# Patient Record
Sex: Male | Born: 1990 | Race: Black or African American | Hispanic: No | Marital: Single | Smoking: Current every day smoker
Health system: Southern US, Community
[De-identification: ages and names within clinical notes are randomized; demographics above are authoritative.]

---
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
## 2013-10-16 ENCOUNTER — Emergency Department (HOSPITAL_COMMUNITY)
Admission: EM | Admit: 2013-10-16 | Discharge: 2013-10-17 | Disposition: A | Discharge status: Home / Self Care | Payer: Worker's Compensation | Attending: Emergency Medicine | Admitting: Emergency Medicine

## 2013-10-16 ENCOUNTER — Encounter (HOSPITAL_COMMUNITY): Payer: Self-pay | Admitting: Emergency Medicine

## 2013-10-16 DIAGNOSIS — Y990 Civilian activity done for income or pay: Secondary | ICD-10-CM | POA: Insufficient documentation

## 2013-10-16 DIAGNOSIS — R198 Other specified symptoms and signs involving the digestive system and abdomen: Secondary | ICD-10-CM | POA: Insufficient documentation

## 2013-10-16 DIAGNOSIS — T23269A Burn of second degree of back of unspecified hand, initial encounter: Secondary | ICD-10-CM | POA: Insufficient documentation

## 2013-10-16 DIAGNOSIS — X088XXA Exposure to other specified smoke, fire and flames, initial encounter: Secondary | ICD-10-CM | POA: Insufficient documentation

## 2013-10-16 DIAGNOSIS — Z23 Encounter for immunization: Secondary | ICD-10-CM | POA: Insufficient documentation

## 2013-10-16 DIAGNOSIS — T23202A Burn of second degree of left hand, unspecified site, initial encounter: Secondary | ICD-10-CM

## 2013-10-16 DIAGNOSIS — Z7982 Long term (current) use of aspirin: Secondary | ICD-10-CM | POA: Insufficient documentation

## 2013-10-16 DIAGNOSIS — F17200 Nicotine dependence, unspecified, uncomplicated: Secondary | ICD-10-CM | POA: Insufficient documentation

## 2013-10-16 DIAGNOSIS — T22212A Burn of second degree of left forearm, initial encounter: Secondary | ICD-10-CM

## 2013-10-16 DIAGNOSIS — Y929 Unspecified place or not applicable: Secondary | ICD-10-CM | POA: Insufficient documentation

## 2013-10-16 DIAGNOSIS — Y9389 Activity, other specified: Secondary | ICD-10-CM | POA: Insufficient documentation

## 2013-10-16 DIAGNOSIS — T22219A Burn of second degree of unspecified forearm, initial encounter: Secondary | ICD-10-CM | POA: Insufficient documentation

## 2013-10-16 MED ORDER — FENTANYL CITRATE 0.05 MG/ML IJ SOLN
INTRAMUSCULAR | Status: AC
  Filled 2013-10-16: qty 2

## 2013-10-16 MED ORDER — FENTANYL CITRATE 0.05 MG/ML IJ SOLN
50.0000 ug | Freq: Once | INTRAMUSCULAR | Status: AC
  Administered 2013-10-16: 50 ug via NASAL

## 2013-10-16 MED ORDER — SILVER SULFADIAZINE 1 % EX CREA
TOPICAL_CREAM | Freq: Once | CUTANEOUS | Status: AC
  Administered 2013-10-16: 1 via TOPICAL
  Filled 2013-10-16: qty 85
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
## 2013-10-17 LAB — RAPID URINE DRUG SCREEN, HOSP PERFORMED
Amphetamines: NOT DETECTED
BARBITURATES: NOT DETECTED
BENZODIAZEPINES: NOT DETECTED
Cocaine: NOT DETECTED
Opiates: NOT DETECTED
Tetrahydrocannabinol: POSITIVE — AB

## 2013-10-17 MED ORDER — TETANUS-DIPHTH-ACELL PERTUSSIS 5-2.5-18.5 LF-MCG/0.5 IM SUSP
0.5000 mL | Freq: Once | INTRAMUSCULAR | Status: AC
  Administered 2013-10-17: 0.5 mL via INTRAMUSCULAR
  Filled 2013-10-17: qty 0.5

## 2013-10-17 MED ORDER — IBUPROFEN 600 MG PO TABS
600.0000 mg | ORAL_TABLET | Freq: Four times a day (QID) | ORAL | Status: DC | PRN
Start: 2013-10-17 — End: 2014-01-28

## 2013-10-17 MED ORDER — SILVER SULFADIAZINE 1 % EX CREA: 1.0000 "application " | TOPICAL_CREAM | Freq: Every day | CUTANEOUS | Status: DC

## 2013-10-17 MED ORDER — OXYCODONE-ACETAMINOPHEN 5-325 MG PO TABS: 1.0000 | ORAL_TABLET | ORAL | Status: DC | PRN

## 2013-10-17 MED ORDER — HYDROCODONE-ACETAMINOPHEN 5-325 MG PO TABS
2.0000 | ORAL_TABLET | Freq: Once | ORAL | Status: AC
  Administered 2013-10-17: 2 via ORAL
  Filled 2013-10-17: qty 2

## 2013-10-17 NOTE — Discharge Instructions (Signed)

## 2014-01-28 ENCOUNTER — Encounter (HOSPITAL_COMMUNITY): Payer: Self-pay | Admitting: Emergency Medicine

## 2014-01-28 ENCOUNTER — Emergency Department (HOSPITAL_COMMUNITY)
Admission: EM | Admit: 2014-01-28 | Discharge: 2014-01-28 | Disposition: A | Discharge status: Home / Self Care | Payer: No Typology Code available for payment source | Attending: Emergency Medicine | Admitting: Emergency Medicine

## 2014-01-28 DIAGNOSIS — R51 Headache: Secondary | ICD-10-CM

## 2014-01-28 DIAGNOSIS — S0990XA Unspecified injury of head, initial encounter: Secondary | ICD-10-CM | POA: Insufficient documentation

## 2014-01-28 DIAGNOSIS — R519 Headache, unspecified: Secondary | ICD-10-CM

## 2014-01-28 DIAGNOSIS — F17200 Nicotine dependence, unspecified, uncomplicated: Secondary | ICD-10-CM | POA: Insufficient documentation

## 2014-01-28 DIAGNOSIS — Y92410 Unspecified street and highway as the place of occurrence of the external cause: Secondary | ICD-10-CM | POA: Insufficient documentation

## 2014-01-28 DIAGNOSIS — R040 Epistaxis: Secondary | ICD-10-CM | POA: Insufficient documentation

## 2014-01-28 DIAGNOSIS — Y9389 Activity, other specified: Secondary | ICD-10-CM | POA: Insufficient documentation

## 2014-01-28 MED ORDER — ACETAMINOPHEN 325 MG PO TABS
650.0000 mg | ORAL_TABLET | Freq: Once | ORAL | Status: AC
  Administered 2014-01-28: 650 mg via ORAL
  Filled 2014-01-28: qty 2
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
## 2014-01-28 NOTE — ED Provider Notes (Signed)
Medical screening examination/treatment/procedure(s) were performed by non-physician practitioner and as supervising physician I was immediately available for consultation/collaboration.     Suzi RootsKevin E Cameshia Cressman, MD 01/28/14 1210

## 2018-05-02 ENCOUNTER — Emergency Department (HOSPITAL_COMMUNITY)
Admission: EM | Admit: 2018-05-02 | Discharge: 2018-05-02 | Disposition: A | Discharge status: Home / Self Care | Payer: 59 | Attending: Emergency Medicine | Admitting: Emergency Medicine

## 2018-05-02 ENCOUNTER — Encounter (HOSPITAL_COMMUNITY): Payer: Self-pay | Admitting: Emergency Medicine

## 2018-05-02 ENCOUNTER — Other Ambulatory Visit: Payer: Self-pay

## 2018-05-02 ENCOUNTER — Emergency Department (HOSPITAL_COMMUNITY): Payer: 59

## 2018-05-02 DIAGNOSIS — F17210 Nicotine dependence, cigarettes, uncomplicated: Secondary | ICD-10-CM | POA: Diagnosis not present

## 2018-05-02 DIAGNOSIS — R509 Fever, unspecified: Secondary | ICD-10-CM | POA: Diagnosis present

## 2018-05-02 DIAGNOSIS — K047 Periapical abscess without sinus: Secondary | ICD-10-CM | POA: Insufficient documentation

## 2018-05-02 DIAGNOSIS — R05 Cough: Secondary | ICD-10-CM | POA: Diagnosis not present

## 2018-05-02 LAB — CBC WITH DIFFERENTIAL/PLATELET
Abs Immature Granulocytes: 0.01 10*3/uL (ref 0.00–0.07)
BASOS ABS: 0 10*3/uL (ref 0.0–0.1)
Basophils Relative: 1 %
EOS PCT: 7 %
Eosinophils Absolute: 0.3 10*3/uL (ref 0.0–0.5)
HCT: 45.6 % (ref 39.0–52.0)
Hemoglobin: 14.8 g/dL (ref 13.0–17.0)
Immature Granulocytes: 0 %
Lymphocytes Relative: 25 %
Lymphs Abs: 1.1 10*3/uL (ref 0.7–4.0)
MCH: 28.5 pg (ref 26.0–34.0)
MCHC: 32.5 g/dL (ref 30.0–36.0)
MCV: 87.7 fL (ref 80.0–100.0)
MONO ABS: 0.5 10*3/uL (ref 0.1–1.0)
Monocytes Relative: 11 %
NRBC: 0 % (ref 0.0–0.2)
Neutro Abs: 2.4 10*3/uL (ref 1.7–7.7)
Neutrophils Relative %: 56 %
Platelets: 183 10*3/uL (ref 150–400)
RBC: 5.2 MIL/uL (ref 4.22–5.81)
RDW: 12.6 % (ref 11.5–15.5)
WBC: 4.3 10*3/uL (ref 4.0–10.5)

## 2018-05-02 LAB — I-STAT CHEM 8, ED
BUN: 9 mg/dL (ref 6–20)
CALCIUM ION: 1.18 mmol/L (ref 1.15–1.40)
Chloride: 102 mmol/L (ref 98–111)
Creatinine, Ser: 1 mg/dL (ref 0.61–1.24)
Glucose, Bld: 90 mg/dL (ref 70–99)
HCT: 45 % (ref 39.0–52.0)
Hemoglobin: 15.3 g/dL (ref 13.0–17.0)
Potassium: 4.2 mmol/L (ref 3.5–5.1)
SODIUM: 139 mmol/L (ref 135–145)
TCO2: 27 mmol/L (ref 22–32)

## 2018-05-02 MED ORDER — SODIUM CHLORIDE 0.9 % IV BOLUS
1000.0000 mL | Freq: Once | INTRAVENOUS | Status: AC
  Administered 2018-05-02: 1000 mL via INTRAVENOUS

## 2018-05-02 MED ORDER — AMOXICILLIN-POT CLAVULANATE 875-125 MG PO TABS: 1.0000 | ORAL_TABLET | Freq: Two times a day (BID) | ORAL | 0 refills | Status: DC

## 2018-05-02 MED ORDER — ACETAMINOPHEN 325 MG PO TABS
650.0000 mg | ORAL_TABLET | Freq: Once | ORAL | Status: AC | PRN
  Administered 2018-05-02: 650 mg via ORAL
  Filled 2018-05-02: qty 2

## 2018-05-02 NOTE — ED Provider Notes (Signed)
[facility]-EMERGENCY DEPT Provider Note   CSN: [id number] Arrival date & time: 05/02/18  0545     History   Chief Complaint Chief Complaint  Patient presents with  . Fever  . Generalized Body Aches    HPI Donna Resor is a 27 y.o. male.  HPI Patient states that he has felt bad to have fevers for the last several days.  Occasional cough.  States he does have a fair amount of pain in his right jaw however.  States his mouth feels dry.  Has had a good appetite but states he does not want to eat anything.  No abdominal pain.  No dysuria.  No sick contacts.  No dysuria.  He does not have a dentist.  No difficulty swallowing. History reviewed. No pertinent past medical history.  There are no active problems to display for this patient.   History reviewed. No pertinent surgical history.      Home Medications    Prior to Admission medications   Medication Sig Start Date End Date Taking? Authorizing Provider  acetaminophen (TYLENOL) 500 MG tablet Take 1,000 mg by mouth every 6 (six) hours as needed for mild pain.   Yes [provider]  ibuprofen (ADVIL,MOTRIN) 200 MG tablet Take 800 mg by mouth every 6 (six) hours as needed for moderate pain.   Yes [provider]  amoxicillin-clavulanate (AUGMENTIN) 875-125 MG tablet Take 1 tablet by mouth every 12 (twelve) hours. 05/02/18   Benjiman Core, MD    Family History No family history on file.  Social History Social History   Tobacco Use  . Smoking status: Current Every Day Smoker  Substance Use Topics  . Alcohol use: Yes  . Drug use: No     Allergies   Patient has no known allergies.   Review of Systems Review of Systems  Constitutional: Positive for appetite change and fever.  HENT: Positive for dental problem.   Respiratory: Negative for shortness of breath.   Gastrointestinal: Negative for abdominal pain.  Genitourinary: Negative for frequency.  Musculoskeletal: Positive  for myalgias. Negative for back pain.  Skin: Negative for rash.  Neurological: Negative for weakness.  Hematological: Negative for adenopathy.  Psychiatric/Behavioral: Negative for confusion.     Physical Exam Updated Vital Signs BP 135/72 (BP Location: Left Arm)   Pulse 85   Temp 98.9 F (37.2 C) (Oral)   Resp 18   Ht 5\' 10"  (1.778 m)   Wt 74.8 kg   SpO2 99%   BMI 23.68 kg/m   Physical Exam  Constitutional: He appears well-developed.  HENT:  Head: Normocephalic.  Tenderness over right lower jaw/mandible.  There is a swelling without fluctuance laterally.  No swelling of floor mouth.  Eyes: Pupils are equal, round, and reactive to light.  Neck: Neck supple.  Cardiovascular: Normal rate.  Pulmonary/Chest:  Mildly harsh breath sounds  Abdominal: There is no tenderness.  Musculoskeletal: He exhibits no edema or tenderness.  Neurological: He is alert.  Skin: Skin is warm. Capillary refill takes less than 2 seconds.     ED Treatments / Results  Labs (all labs ordered are listed, but only abnormal results are displayed) Labs Reviewed  CBC WITH DIFFERENTIAL/PLATELET  I-STAT CHEM 8, ED    EKG None  Radiology Dg Chest 2 View  Result Date: 05/02/2018 CLINICAL DATA:  Fever. EXAM: CHEST - 2 VIEW COMPARISON:  None. FINDINGS: The heart size and mediastinal contours are within normal limits. Both lungs are clear. The visualized  skeletal structures are unremarkable. IMPRESSION: No active cardiopulmonary disease. Electronically Signed   By: Lupita Raider, M.D.   On: 05/02/2018 07:50    Procedures Procedures (including critical care time)  Medications Ordered in ED Medications  acetaminophen (TYLENOL) tablet 650 mg (650 mg Oral Given 05/02/18 0731)  sodium chloride 0.9 % bolus 1,000 mL (0 mLs Intravenous Stopped 05/02/18 0833)     Initial Impression / Assessment and Plan / ED Course  I have reviewed the triage vital signs and the nursing notes.  Pertinent labs &  imaging results that were available during my care of the patient were reviewed by me and considered in my medical decision making (see chart for details).    Patient's fever.  I think due to dental abscess.  Labs reassuring.  Minus fever vitals reassuring.  Will discharge home with oral treatment.  Outpatient follow-up.  Final Clinical Impressions(s) / ED Diagnoses   Final diagnoses:  Dental infection    ED Discharge Orders         Ordered    amoxicillin-clavulanate (AUGMENTIN) 875-125 MG tablet  Every 12 hours     05/02/18 0835           Benjiman Core, MD 05/02/18 1610

## 2018-05-02 NOTE — ED Triage Notes (Signed)
Pt from home with c/o fever and generalized aches x 2 days. Pt denies cough or cold. Pt reports he last took motrin around 2100 last night

## 2018-05-02 NOTE — Discharge Instructions (Signed)
Follow up with a dentist

## 2021-02-25 ENCOUNTER — Encounter (HOSPITAL_COMMUNITY): Payer: Self-pay | Admitting: *Deleted

## 2021-02-25 ENCOUNTER — Other Ambulatory Visit: Payer: Self-pay

## 2021-02-25 ENCOUNTER — Emergency Department (HOSPITAL_COMMUNITY)
Admission: EM | Admit: 2021-02-25 | Discharge: 2021-02-25 | Disposition: A | Discharge status: Home / Self Care | Payer: Self-pay | Attending: Emergency Medicine | Admitting: Emergency Medicine

## 2021-02-25 ENCOUNTER — Emergency Department (HOSPITAL_COMMUNITY): Payer: Self-pay

## 2021-02-25 ENCOUNTER — Emergency Department (HOSPITAL_BASED_OUTPATIENT_CLINIC_OR_DEPARTMENT_OTHER): Payer: Self-pay

## 2021-02-25 DIAGNOSIS — F17200 Nicotine dependence, unspecified, uncomplicated: Secondary | ICD-10-CM | POA: Insufficient documentation

## 2021-02-25 DIAGNOSIS — M79604 Pain in right leg: Secondary | ICD-10-CM | POA: Insufficient documentation

## 2021-02-25 DIAGNOSIS — M25561 Pain in right knee: Secondary | ICD-10-CM

## 2021-02-25 MED ORDER — PREDNISONE 20 MG PO TABS
60.0000 mg | ORAL_TABLET | ORAL | Status: AC
  Administered 2021-02-25: 60 mg via ORAL
  Filled 2021-02-25: qty 3

## 2021-02-25 MED ORDER — PREDNISONE 20 MG PO TABS: 40.0000 mg | ORAL_TABLET | Freq: Every day | ORAL | 0 refills | Status: AC

## 2021-02-25 NOTE — ED Provider Notes (Signed)
Emergency Medicine Provider Triage Evaluation Note  Antonio Thornton , a 30 y.o. male  was evaluated in triage.  Pt complains of right posterior knee pain onset 1 week ago.  Pain started suddenly.  No long road trips, immobilization, chest pain, shortness of breath.  No swelling or redness to lower extremities.  No fever.  No known clotting disorders, history of PE or DVT  Review of Systems  Positive: Right knee pain Negative: Numbness, weakness, injury, swelling, redness, warmth  Physical Exam  BP (!) 141/81 (BP Location: Right Arm)   Pulse 82   Temp 98.3 F (36.8 C) (Oral)   Resp 18   SpO2 100%  Gen:   Awake, no distress   Resp:  Normal effort  MSK:   Moves extremities without difficulty, tenderness to right popliteal fossa, calves soft Other:    Medical Decision Making  Medically screening exam initiated at 9:26 AM.  Appropriate orders placed.  Antonio Thornton was informed that the remainder of the evaluation will be completed by another provider, this initial triage assessment does not replace that evaluation, and the importance of remaining in the ED until their evaluation is complete.  Right knee pain   Annalisa Colonna A, PA-C 02/25/21 9242    Gerhard Munch, MD 02/25/21 1035

## 2021-02-25 NOTE — ED Triage Notes (Signed)
Pt reports right leg pain behind his knee x 1 week. Pain radiates down into his right foot. Denies injury. Ambulatory at triage.

## 2021-02-25 NOTE — Discharge Instructions (Addendum)
As discussed, today's evaluation has been generally reassuring.  If you develop new, or concerning changes, return here.  Otherwise placed take your medication as directed and follow-up with our orthopedic colleagues in 1 week as needed.

## 2021-02-25 NOTE — ED Provider Notes (Signed)
[facility] EMERGENCY DEPARTMENT Provider Note   CSN: [id number] Arrival date & time: 02/25/21  0865     History Chief Complaint  Patient presents with   Leg Pain    Antonio Thornton is a 30 y.o. male.  HPI Adult male presents with concern for right leg pain x1 week.  He is generally well, taking no medication regularly, describing himself as active.  Without obvious precipitant 1 week ago he developed some pain about the midfoot.  Soon thereafter he noticed pain in the posterior right knee.  Since that time pain has been persistent in spite of using lidocaine ointment and Epson salt baths.  Pain is worse with motion, described as sore, severe with activity.  No proximal pain, no systemic complaints including fever, chills, nausea, vomiting, chest pain, dyspnea.  No superficial changes, no obvious size discrepancy.    History reviewed. No pertinent past medical history.  There are no problems to display for this patient.   History reviewed. No pertinent surgical history.     History reviewed. No pertinent family history.  Social History   Tobacco Use   Smoking status: Every Day  Substance Use Topics   Alcohol use: Yes   Drug use: No    Home Medications Prior to Admission medications   Medication Sig Start Date End Date Taking? Authorizing Provider  acetaminophen (TYLENOL) 500 MG tablet Take 1,000 mg by mouth every 6 (six) hours as needed for mild pain.    [provider]  amoxicillin-clavulanate (AUGMENTIN) 875-125 MG tablet Take 1 tablet by mouth every 12 (twelve) hours. 05/02/18   Benjiman Core, MD  ibuprofen (ADVIL,MOTRIN) 200 MG tablet Take 800 mg by mouth every 6 (six) hours as needed for moderate pain.    [provider]    Allergies    Patient has no known allergies.  Review of Systems   Review of Systems  Constitutional:        Per HPI, otherwise negative  HENT:         Per HPI, otherwise negative  Respiratory:          Per HPI, otherwise negative  Cardiovascular:        Per HPI, otherwise negative  Gastrointestinal:  Negative for vomiting.  Endocrine:       Negative aside from HPI  Genitourinary:        Neg aside from HPI   Musculoskeletal:        Per HPI, otherwise negative  Skin: Negative.   Neurological:  Negative for syncope.   Physical Exam Updated Vital Signs BP (!) 141/81 (BP Location: Right Arm)   Pulse 82   Temp 98.3 F (36.8 C) (Oral)   Resp 18   SpO2 100%   Physical Exam Vitals and nursing note reviewed.  Constitutional:      General: He is not in acute distress.    Appearance: He is well-developed.  HENT:     Head: Normocephalic and atraumatic.  Eyes:     Conjunctiva/sclera: Conjunctivae normal.  Cardiovascular:     Rate and Rhythm: Normal rate and regular rhythm.     Pulses: Normal pulses.  Pulmonary:     Effort: Pulmonary effort is normal. No respiratory distress.     Breath sounds: No stridor.  Abdominal:     General: There is no distension.  Musculoskeletal:     Comments: Right foot unremarkable, Achilles tendon function intact, calf nontender.  Hip unremarkable.  There is tenderness palpation in the  proximal popliteal fossa, without deformity.  Patient has slight limitation on extension, 170, limitation on flexion, 90  Skin:    General: Skin is warm and dry.  Neurological:     Mental Status: He is alert and oriented to person, place, and time.    ED Results / Procedures / Treatments   Labs (all labs ordered are listed, but only abnormal results are displayed) Labs Reviewed - No data to display  EKG None  Radiology DG Knee Complete 4 Views Right  Result Date: 02/25/2021 CLINICAL DATA:  Knee pain for a week EXAM: RIGHT KNEE - COMPLETE 4+ VIEW COMPARISON:  None. FINDINGS: There is no acute fracture or dislocation. Knee alignment is maintained. The joint spaces are preserved. There is no significant effusion. IMPRESSION: Normal knee radiographs. Electronically  Signed   By: Lesia Hausen M.D.   On: 02/25/2021 10:29    Procedures Procedures   Medications Ordered in ED Medications - No data to display  ED Course  I have reviewed the triage vital signs and the nursing notes.  Pertinent labs & imaging results that were available during my care of the patient were reviewed by me and considered in my medical decision making (see chart for details).   12:46 PM On repeat exam patient is awake, alert, in no distress.  I reviewed his x-ray, ultrasound, discussed ultrasound with the sonographer.  No evidence for DVT, popliteal cyst, no evidence for fracture on x-ray, patient has no new complaints.  He is awake, alert, distally neurovascularly intact, some suspicion for soft tissue injury given the reassuring findings.  Patient amenable to Ace wrap, anti-inflammatories, Ortho follow-up.  Final Clinical Impression(s) / ED Diagnoses Final diagnoses:  Acute pain of right knee     Gerhard Munch, MD 02/25/21 1247

## 2021-02-25 NOTE — Progress Notes (Signed)
Lower extremity venous RT study completed.  Preliminary results relayed to Jeraldine Loots, MD via secure chat.  See CV Proc for preliminary results report.   Jean Rosenthal, RDMS, RVT

## 2021-06-22 ENCOUNTER — Other Ambulatory Visit: Payer: Self-pay

## 2021-06-22 ENCOUNTER — Emergency Department (HOSPITAL_COMMUNITY)
Admission: EM | Admit: 2021-06-22 | Discharge: 2021-06-22 | Disposition: A | Discharge status: Home / Self Care | Payer: Self-pay | Attending: Emergency Medicine | Admitting: Emergency Medicine

## 2021-06-22 DIAGNOSIS — F17200 Nicotine dependence, unspecified, uncomplicated: Secondary | ICD-10-CM | POA: Insufficient documentation

## 2021-06-22 DIAGNOSIS — Z20822 Contact with and (suspected) exposure to covid-19: Secondary | ICD-10-CM | POA: Insufficient documentation

## 2021-06-22 DIAGNOSIS — J029 Acute pharyngitis, unspecified: Secondary | ICD-10-CM | POA: Insufficient documentation

## 2021-06-22 LAB — RESP PANEL BY RT-PCR (FLU A&B, COVID) ARPGX2
Influenza A by PCR: NEGATIVE
Influenza B by PCR: NEGATIVE
SARS Coronavirus 2 by RT PCR: NEGATIVE

## 2021-06-22 LAB — GROUP A STREP BY PCR: Group A Strep by PCR: NOT DETECTED

## 2021-06-22 MED ORDER — IBUPROFEN 800 MG PO TABS: 800.0000 mg | ORAL_TABLET | Freq: Three times a day (TID) | ORAL | 0 refills | Status: AC | PRN

## 2021-06-22 MED ORDER — CHLORASEPTIC GARGLE 1.4 % MT LIQD: 1.0000 | OROMUCOSAL | 0 refills | Status: AC | PRN

## 2021-06-22 MED ORDER — KETOROLAC TROMETHAMINE 30 MG/ML IJ SOLN
30.0000 mg | Freq: Once | INTRAMUSCULAR | Status: AC
  Administered 2021-06-22: 21:00:00 30 mg via INTRAMUSCULAR
  Filled 2021-06-22: qty 1

## 2021-06-22 NOTE — ED Triage Notes (Signed)
Pt with sore throat, cough, nasal congestion x 2 days.

## 2021-06-22 NOTE — ED Provider Notes (Signed)
[place] [facility] EMERGENCY DEPARTMENT Provider Note   CSN: [id number] Arrival date & time: 06/22/21  1537     History Chief Complaint  Patient presents with   Sore Throat    Antonio Thornton is a 30 y.o. male.  HPI  30 year old male presents emergency department with a couple days of generalized illness.  He is complaining of nonproductive cough, nasal congestion, fatigue, chills and sore throat.  He has been around a couple sick contacts.  No known flu or COVID contacts.  Currently he is complaining of mild throat pain specifically when he swallows.  Denies any abdominal pain, GI symptoms.  Is not taking any over-the-counter medication for symptom relief.  No past medical history on file.  There are no problems to display for this patient.   No past surgical history on file.     No family history on file.  Social History   Tobacco Use   Smoking status: Every Day  Substance Use Topics   Alcohol use: Yes   Drug use: No    Home Medications Prior to Admission medications   Medication Sig Start Date End Date Taking? Authorizing Provider  phenol (CHLORASEPTIC GARGLE) 1.4 % LIQD Use as directed 1 spray in the mouth or throat as needed for throat irritation / pain. 06/22/21  Yes Asenath Balash, Alvin Critchley, DO  acetaminophen (TYLENOL) 500 MG tablet Take 1,000 mg by mouth every 6 (six) hours as needed for mild pain.    [provider]  amoxicillin-clavulanate (AUGMENTIN) 875-125 MG tablet Take 1 tablet by mouth every 12 (twelve) hours. 05/02/18   Davonna Belling, MD  ibuprofen (IBU) 800 MG tablet Take 1 tablet (800 mg total) by mouth every 8 (eight) hours as needed for moderate pain. 06/22/21   Priyah Schmuck, Drue Dun M, DO  predniSONE (DELTASONE) 20 MG tablet Take 2 tablets (40 mg total) by mouth daily with breakfast. For the next four days 02/25/21   Carmin Muskrat, MD    Allergies    Patient has no known allergies.  Review of Systems   Review of Systems   Constitutional:  Positive for appetite change and chills. Negative for fever.  HENT:  Positive for congestion and sore throat.   Eyes:  Negative for visual disturbance.  Respiratory:  Positive for cough. Negative for shortness of breath.   Cardiovascular:  Negative for chest pain, palpitations and leg swelling.  Gastrointestinal:  Negative for abdominal pain, diarrhea and vomiting.  Genitourinary:  Negative for dysuria.  Musculoskeletal:  Negative for back pain.  Skin:  Negative for rash.  Neurological:  Negative for headaches.   Physical Exam Updated Vital Signs BP 112/68 (BP Location: Right Arm)    Pulse 71    Temp 98.4 F (36.9 C) (Oral)    Resp 15    SpO2 99%   Physical Exam Vitals and nursing note reviewed.  Constitutional:      General: He is not in acute distress.    Appearance: Normal appearance. He is not ill-appearing.  HENT:     Head: Normocephalic.     Mouth/Throat:     Mouth: Mucous membranes are moist.     Pharynx: Posterior oropharyngeal erythema present. No oropharyngeal exudate.  Cardiovascular:     Rate and Rhythm: Normal rate.  Pulmonary:     Effort: Pulmonary effort is normal. No respiratory distress.  Abdominal:     Palpations: Abdomen is soft.     Tenderness: There is no abdominal tenderness.  Skin:    General:  Skin is warm.  Neurological:     Mental Status: He is alert and oriented to person, place, and time. Mental status is at baseline.  Psychiatric:        Mood and Affect: Mood normal.    ED Results / Procedures / Treatments   Labs (all labs ordered are listed, but only abnormal results are displayed) Labs Reviewed  RESP PANEL BY RT-PCR (FLU A&B, COVID) ARPGX2  GROUP A STREP BY PCR    EKG None  Radiology No results found.  Procedures Procedures   Medications Ordered in ED Medications  ketorolac (TORADOL) 30 MG/ML injection 30 mg (30 mg Intramuscular Given 06/22/21 2104)    ED Course  I have reviewed the triage vital signs and  the nursing notes.  Pertinent labs & imaging results that were available during my care of the patient were reviewed by me and considered in my medical decision making (see chart for details).    MDM Rules/Calculators/A&P                          30 year old male presents emergency department generalized illness for the past 2 days.  Vitals are stable on arrival.  Patient is very well-appearing.  Mild pharyngeal erythema on exam, no exudates.  Clear lungs.  Influenza, COVID and strep swab are negative.  Plan for symptomatic over-the-counter medication treatment for viral syndrome.  Patient at this time appears safe and stable for discharge and will be treated as an outpatient.  Discharge plan and strict return to ED precautions discussed, patient verbalizes understanding and agreement.      Final Clinical Impression(s) / ED Diagnoses Final diagnoses:  Pharyngitis, unspecified etiology    Rx / DC Orders ED Discharge Orders          Ordered    phenol (CHLORASEPTIC GARGLE) 1.4 % LIQD  As needed        06/22/21 2057    ibuprofen (IBU) 800 MG tablet  Every 8 hours PRN        06/22/21 2057             Rozelle Logan, DO 06/22/21 2144

## 2021-06-22 NOTE — Discharge Instructions (Signed)
You have been seen and discharged from the emergency department.  Take Tylenol and ibuprofen as needed for pain control.  Stay well-hydrated.  Use throat spray as needed for irritation/pain.  Your flu, COVID and strep swab were negative.  Follow-up with your primary provider for reevaluation and further care. Take home medications as prescribed. If you have any worsening symptoms or further concerns for your health please return to an emergency department for further evaluation.

## 2021-12-10 ENCOUNTER — Encounter (HOSPITAL_COMMUNITY): Payer: Self-pay | Admitting: Emergency Medicine

## 2021-12-10 ENCOUNTER — Emergency Department (HOSPITAL_COMMUNITY)
Admission: EM | Admit: 2021-12-10 | Discharge: 2021-12-11 | Discharge status: Against Medical Advice | Payer: Self-pay | Attending: Emergency Medicine | Admitting: Emergency Medicine

## 2021-12-10 ENCOUNTER — Other Ambulatory Visit: Payer: Self-pay

## 2021-12-10 DIAGNOSIS — J020 Streptococcal pharyngitis: Secondary | ICD-10-CM | POA: Insufficient documentation

## 2021-12-10 DIAGNOSIS — Z5321 Procedure and treatment not carried out due to patient leaving prior to being seen by health care provider: Secondary | ICD-10-CM | POA: Insufficient documentation

## 2021-12-10 NOTE — ED Triage Notes (Signed)
Pt reported to ED stating "I think I have strep throat". States throat has been sore since Monday, and he feels febrile and has had intermittent chills.

## 2021-12-11 LAB — GROUP A STREP BY PCR: Group A Strep by PCR: NOT DETECTED

## 2021-12-11 NOTE — ED Notes (Signed)
Called patient 3 times No Answer.

## 2021-12-12 ENCOUNTER — Emergency Department (HOSPITAL_COMMUNITY): Payer: Self-pay

## 2021-12-12 ENCOUNTER — Emergency Department (HOSPITAL_COMMUNITY)
Admission: EM | Admit: 2021-12-12 | Discharge: 2021-12-12 | Disposition: A | Discharge status: Home / Self Care | Payer: Self-pay | Attending: Emergency Medicine | Admitting: Emergency Medicine

## 2021-12-12 ENCOUNTER — Encounter (HOSPITAL_COMMUNITY): Payer: Self-pay

## 2021-12-12 DIAGNOSIS — Z20822 Contact with and (suspected) exposure to covid-19: Secondary | ICD-10-CM | POA: Insufficient documentation

## 2021-12-12 DIAGNOSIS — J36 Peritonsillar abscess: Secondary | ICD-10-CM | POA: Insufficient documentation

## 2021-12-12 DIAGNOSIS — R0602 Shortness of breath: Secondary | ICD-10-CM | POA: Insufficient documentation

## 2021-12-12 DIAGNOSIS — D72829 Elevated white blood cell count, unspecified: Secondary | ICD-10-CM | POA: Insufficient documentation

## 2021-12-12 DIAGNOSIS — R1013 Epigastric pain: Secondary | ICD-10-CM | POA: Insufficient documentation

## 2021-12-12 DIAGNOSIS — R079 Chest pain, unspecified: Secondary | ICD-10-CM | POA: Insufficient documentation

## 2021-12-12 DIAGNOSIS — R7309 Other abnormal glucose: Secondary | ICD-10-CM | POA: Insufficient documentation

## 2021-12-12 DIAGNOSIS — J029 Acute pharyngitis, unspecified: Secondary | ICD-10-CM

## 2021-12-12 LAB — BASIC METABOLIC PANEL
Anion gap: 17 — ABNORMAL HIGH (ref 5–15)
BUN: 13 mg/dL (ref 6–20)
CO2: 19 mmol/L — ABNORMAL LOW (ref 22–32)
Calcium: 10 mg/dL (ref 8.9–10.3)
Chloride: 104 mmol/L (ref 98–111)
Creatinine, Ser: 1.22 mg/dL (ref 0.61–1.24)
GFR, Estimated: >60 mL/min (ref >60)
Glucose, Bld: 119 mg/dL — ABNORMAL HIGH (ref 70–99)
Potassium: 3.8 mmol/L (ref 3.5–5.1)
Sodium: 140 mmol/L (ref 135–145)

## 2021-12-12 LAB — CBC
HCT: 43.7 % (ref 39.0–52.0)
Hemoglobin: 14.6 g/dL (ref 13.0–17.0)
MCH: 28.7 pg (ref 26.0–34.0)
MCHC: 33.4 g/dL (ref 30.0–36.0)
MCV: 85.9 fL (ref 80.0–100.0)
Platelets: 254 10*3/uL (ref 150–400)
RBC: 5.09 MIL/uL (ref 4.22–5.81)
RDW: 11.8 % (ref 11.5–15.5)
WBC: 14 10*3/uL — ABNORMAL HIGH (ref 4.0–10.5)
nRBC: 0 % (ref 0.0–0.2)

## 2021-12-12 LAB — RESP PANEL BY RT-PCR (FLU A&B, COVID) ARPGX2
Influenza A by PCR: NEGATIVE
Influenza B by PCR: NEGATIVE
SARS Coronavirus 2 by RT PCR: NEGATIVE

## 2021-12-12 LAB — LACTIC ACID, PLASMA: Lactic Acid, Venous: 0.9 mmol/L (ref 0.5–1.9)

## 2021-12-12 LAB — TROPONIN I (HIGH SENSITIVITY)
Troponin I (High Sensitivity): 3 ng/L (ref <18)
Troponin I (High Sensitivity): 3 ng/L (ref <18)

## 2021-12-12 LAB — GROUP A STREP BY PCR: Group A Strep by PCR: NOT DETECTED

## 2021-12-12 MED ORDER — IOHEXOL 300 MG/ML  SOLN
100.0000 mL | Freq: Once | INTRAMUSCULAR | Status: AC | PRN
  Administered 2021-12-12: 100 mL via INTRAVENOUS

## 2021-12-12 MED ORDER — SODIUM CHLORIDE 0.9 % IV SOLN
2.0000 g | Freq: Once | INTRAVENOUS | Status: AC
  Administered 2021-12-12: 2 g via INTRAVENOUS
  Filled 2021-12-12: qty 20

## 2021-12-12 MED ORDER — LIDOCAINE VISCOUS HCL 2 % MT SOLN
15.0000 mL | Freq: Once | OROMUCOSAL | Status: AC
Start: 2021-12-12 — End: 2021-12-12
  Administered 2021-12-12: 15 mL via OROMUCOSAL
  Filled 2021-12-12: qty 15

## 2021-12-12 MED ORDER — AMOXICILLIN-POT CLAVULANATE 875-125 MG PO TABS: 1.0000 | ORAL_TABLET | Freq: Two times a day (BID) | ORAL | 0 refills | Status: DC

## 2021-12-12 MED ORDER — SODIUM CHLORIDE 0.9 % IV BOLUS
1000.0000 mL | Freq: Once | INTRAVENOUS | Status: AC
  Administered 2021-12-12: 1000 mL via INTRAVENOUS

## 2021-12-12 MED ORDER — DEXAMETHASONE SODIUM PHOSPHATE 10 MG/ML IJ SOLN
10.0000 mg | Freq: Once | INTRAMUSCULAR | Status: AC
  Administered 2021-12-12: 10 mg via INTRAVENOUS
  Filled 2021-12-12: qty 1

## 2021-12-12 MED ORDER — PANTOPRAZOLE SODIUM 40 MG IV SOLR
40.0000 mg | Freq: Once | INTRAVENOUS | Status: AC
  Administered 2021-12-12: 40 mg via INTRAVENOUS
  Filled 2021-12-12: qty 10

## 2021-12-12 NOTE — ED Provider Notes (Signed)
I provided a substantive portion of the care of this patient.  I personally performed the entirety of the exam for this encounter.     Patient has sore throat and pain with swallowing onset 7 days ago.  Patient is examined after hydration, Decadron and Rocephin.  Patient reports he feels much better.  He reports pain on the right side of his neck is nearly resolved.  He denies any difficulty swallowing or breathing.  He would like to try oral intake.  Patient is nontoxic.  Mental status is clear.  Neck is supple.  Mild fullness and tenderness in the right tonsillar space.  No meningismus.  Patient has moderate fullness to the right tonsil but airway is widely patent posteriorly.  I have reviewed results of CT scan.  ENT consulted by [facility].  At this time patient is stable to continue home management with oral antibiotics and pain control.  I have reviewed careful return precautions with the patient.  He voices understanding.   Arby Barrette, MD 12/12/21 [phone]

## 2021-12-12 NOTE — ED Provider Notes (Signed)
Highlighted in ED course. [facility] EMERGENCY DEPARTMENT Provider Note   CSN: [id number] Arrival date & time: 12/12/21  1443     History Chief Complaint  Patient presents with   Chest Pain    Antonio Thornton is a 31 y.o. male with no significant past medical history who presents to the emergency department today with a sore throat, shortness of breath, and chest pain that started 7 days ago.  Initially, symptoms started with a sore throat primarily on the right side that radiated into the right side of the face and ear.  Chest pain started on Wednesday.  Patient came to the emergency department but left due to wait time earlier in the week.  Today, patient started vomiting and had some hematemesis.  He denies diarrhea, urinary complaints, abdominal pain.  He has not been taking any medications for this.  Chart review indicated the patient was seen here back in December for similar symptoms with regards to the sore throat.  He was ultimately diagnosed with pharyngitis.   Chest Pain      Home Medications Prior to Admission medications   Medication Sig Start Date End Date Taking? Authorizing Provider  amoxicillin-clavulanate (AUGMENTIN) 875-125 MG tablet Take 1 tablet by mouth every 12 (twelve) hours. 12/12/21  Yes Meredeth Ide, Nael Petrosyan M, PA-C  acetaminophen (TYLENOL) 500 MG tablet Take 1,000 mg by mouth every 6 (six) hours as needed for mild pain.    [provider]  ibuprofen (IBU) 800 MG tablet Take 1 tablet (800 mg total) by mouth every 8 (eight) hours as needed for moderate pain. 06/22/21   Horton, Clabe Seal, DO  phenol (CHLORASEPTIC GARGLE) 1.4 % LIQD Use as directed 1 spray in the mouth or throat as needed for throat irritation / pain. 06/22/21   Horton, Danford Bad M, DO  predniSONE (DELTASONE) 20 MG tablet Take 2 tablets (40 mg total) by mouth daily with breakfast. For the next four days 02/25/21   Gerhard Munch, MD      Allergies    Patient has no known  allergies.    Review of Systems   Review of Systems  Cardiovascular:  Positive for chest pain.  All other systems reviewed and are negative.   Physical Exam Updated Vital Signs BP 119/77   Pulse 87   Temp 98.7 F (37.1 C) (Oral)   Resp 19   SpO2 100%  Physical Exam Vitals and nursing note reviewed.  Constitutional:      General: He is not in acute distress.    Appearance: Normal appearance.  HENT:     Head: Normocephalic and atraumatic.     Mouth/Throat:     Comments: Difficult to visualize the posterior pharynx as patient has difficulty opening the mouth secondary to pain.  On the brief look that I did see uvula was midline but there was moderate amount of pharyngeal erythema.  Unable to visualize any exudate. Eyes:     General:        Right eye: No discharge.        Left eye: No discharge.  Cardiovascular:     Comments: Tachycardic up to 105. S1/S2 are distinct without any evidence of murmur, rubs, or gallops.  Radial pulses are 2+ bilaterally.  Dorsalis pedis pulses are 2+ bilaterally.  No evidence of pedal edema. Pulmonary:     Comments: Clear to auscultation bilaterally.  Normal effort.  No respiratory distress.  No evidence of wheezes, rales, or rhonchi heard throughout. Abdominal:  General: Abdomen is flat. Bowel sounds are normal. There is no distension.     Tenderness: There is abdominal tenderness in the epigastric area. There is no guarding or rebound.  Musculoskeletal:        General: Normal range of motion.     Cervical back: Neck supple.  Skin:    General: Skin is warm and dry.     Findings: No rash.  Neurological:     General: No focal deficit present.     Mental Status: He is alert.  Psychiatric:        Mood and Affect: Mood normal.        Behavior: Behavior normal.     ED Results / Procedures / Treatments   Labs (all labs ordered are listed, but only abnormal results are displayed) Labs Reviewed  BASIC METABOLIC PANEL - Abnormal; Notable for  the following components:      Result Value   CO2 19 (*)    Glucose, Bld 119 (*)    Anion gap 17 (*)    All other components within normal limits  CBC - Abnormal; Notable for the following components:   WBC 14.0 (*)    All other components within normal limits  GROUP A STREP BY PCR  RESP PANEL BY RT-PCR (FLU A&B, COVID) ARPGX2  LACTIC ACID, PLASMA  TROPONIN I (HIGH SENSITIVITY)  TROPONIN I (HIGH SENSITIVITY)    EKG None  Radiology CT Soft Tissue Neck W Contrast  Result Date: 12/12/2021 CLINICAL DATA:  31 year old male with possible tonsillar abscess. EXAM: CT NECK WITH CONTRAST TECHNIQUE: Multidetector CT imaging of the neck was performed using the standard protocol following the bolus administration of intravenous contrast. RADIATION DOSE REDUCTION: This exam was performed according to the departmental dose-optimization program which includes automated exposure control, adjustment of the mA and/or kV according to patient size and/or use of iterative reconstruction technique. CONTRAST:  OMNIPAQUE IOHEXOL 300 MG/ML  SOLN COMPARISON:  None Available. FINDINGS: Pharynx and larynx: Larynx and epiglottis are within normal limits. Mildly hypertrophied lingual tonsil, adenoids. Moderately hypertrophied and asymmetric palatine tonsils, with both asymmetric hyperenhancement of the right tonsil, and a stellate or crescent shaped area of internal tonsillar hypodensity consistent with abscess (series 3, image 48). The abscess appears to track to the midline at the uvula. It is dominant component is 9 x 21 x 13 mm (AP by transverse by CC) for an estimated volume of about 2 mL. Surprisingly little inflammation in the right parapharyngeal space. Left parapharyngeal space remains normal. Minimal if any retropharyngeal space edema. Salivary glands: Sublingual space and bilateral salivary glands are within normal limits. Thyroid: Negative. Lymph nodes: Reactive appearing bilateral level 2 lymph nodes are  larger on the right, up to 13 mm short axis on that side. Small enhancing retropharyngeal lymph nodes appear reactive. No cystic or necrotic nodes. Vascular: Major vascular structures in the neck and at the skull base appear patent and normal. Limited intracranial: Negative. Visualized orbits: Negative. Mastoids and visualized paranasal sinuses: Only trace maxillary sinus mucosal thickening. Tympanic cavities and mastoids are clear. No sinus fluid levels. Skeleton: Poor dentition. Carious posterior dentition with multifocal bilateral periapical lucency. No other No acute osseous abnormality identified. Upper chest: Negative. IMPRESSION: 1. Positive for Acute Tonsillitis with a Right Tonsillar Abscess (crescent-shaped, up to 2.1 cm), tracking toward the uvula. 2. Reactive retropharyngeal and right greater than left level 2 lymph nodes. 3. Poor dentition. Electronically Signed   By: Althea Grimmer.D.  On: 12/12/2021 11:53   DG Chest 2 View  Result Date: 12/12/2021 CLINICAL DATA:  Chest pain, nausea, and shortness of breath. EXAM: CHEST - 2 VIEW COMPARISON:  PA and lateral 05/02/2018 FINDINGS: The cardiac size is normal. The lungs are expiratory with limited view of the lower zones. The visualized lungs clear and no pleural effusion is seen. There is a normal mediastinal configuration with intact thoracic cage. IMPRESSION: Negative expiratory study.  Limited view of the lower zones. Electronically Signed   By: Almira Bar M.D.   On: 12/12/2021 07:35    Procedures Procedures    Medications Ordered in ED Medications  lidocaine (XYLOCAINE) 2 % viscous mouth solution 15 mL (15 mLs Mouth/Throat Given 12/12/21 1055)  pantoprazole (PROTONIX) injection 40 mg (40 mg Intravenous Given 12/12/21 1056)  iohexol (OMNIPAQUE) 300 MG/ML solution 100 mL (100 mLs Intravenous Contrast Given 12/12/21 1138)  sodium chloride 0.9 % bolus 1,000 mL (0 mLs Intravenous Stopped 12/12/21 1427)  dexamethasone (DECADRON) injection 10 mg  (10 mg Intravenous Given 12/12/21 1304)  cefTRIAXone (ROCEPHIN) 2 g in sodium chloride 0.9 % 100 mL IVPB (0 g Intravenous Stopped 12/12/21 1427)    ED Course/ Medical Decision Making/ A&P Clinical Course as of 12/12/21 1527  Sun Dec 12, 2021  1233 I spoke with Dr. Pollyann Kennedy with ENT who states that these tonsillar abscesses typically do not get drained and need to be treated with IV antibiotics. [CF]  1400 CBC(!) There is evidence of leukocytosis without any evidence of anemia. [CF]  1400 Basic metabolic panel(!) BMP shows elevated glucose but otherwise no abnormalities. [CF]  1401 Group A Strep by PCR Group A strep is negative. [CF]  1401 Lactic acid, plasma Lactic acid is normal.  Will cancel second lactic. [CF]  1401 Troponin I (High Sensitivity) Initial and delta troponin are negative. [CF]  1401 Resp Panel by RT-PCR (Flu A&B, Covid) Throat COVID and flu are negative. [CF]  1401 DG Chest 2 View I personally interpreted this x-ray which did not show any evidence of pneumonia. [CF]  1401 CT Soft Tissue Neck W Contrast I personally ordered and interpreted this image which did show evidence of a tonsillar abscess that was quite large.  I do agree with the radiologist interpretation. [CF]    Clinical Course User Index [CF] Teressa Lower, PA-C                           Medical Decision Making Amillion Bizon is a 31 y.o. male patient who presents to the emergency department with shortness of breath, chest pain, and sore throat.  Patient does appear uncomfortable but is nontoxic appearing.  Patient has been in the waiting room for approximately 2 hours and just back to treatment room.  Initial vitals did show evidence of tachycardia and tachypnea.  Tachycardia has since improved back in the treatment area to 100-105.  Patient has mild trismus secondary to pain.  He is talking in complete sentences and breathing normally.  No inspiratory stridor.  After reviewing the initial labs, I will low  concern for ACS at this time.  Considering that I cannot accurately visualize the posterior pharynx I will get a scan of the neck to further evaluate for RPA or PTA.  I will also add on COVID, flu, and strep testing.  I will also give him some Protonix for hematemesis.  He does have some epigastric abdominal pain.  For pain control, I will plan  to give him viscous lidocaine.  Will reassess.   Amount and/or Complexity of Data Reviewed External Data Reviewed: notes.    Details: Highlighted in HPI. Labs: ordered. Decision-making details documented in ED Course. Radiology: ordered and independent interpretation performed. Decision-making details documented in ED Course.    Details: Highlighted in ED course.  Risk Prescription drug management. Parenteral controlled substances. Decision regarding hospitalization. Risk Details: Patient presented with sore throat which ultimately was likely secondary to large tonsillar abscess.  Patient was given Decadron in addition to IV antibiotics and fluids.  After fluids, steroids, and antibiotics patient is feeling better and would like to trial fluids.  If patient tolerates p.o. fluids without any difficulty I feel the patient can likely go home with Augmentin and follow-up with ENT within the next week.   Patient tolerated p.o. challenge without any difficulty.  We will discharge him now.  Final Clinical Impression(s) / ED Diagnoses Final diagnoses:  Tonsillar abscess  Pharyngitis, unspecified etiology    Rx / DC Orders ED Discharge Orders          Ordered    amoxicillin-clavulanate (AUGMENTIN) 875-125 MG tablet  Every 12 hours        12/12/21 1458              Hendricks Limes, PA-C 12/12/21 1527    Charlesetta Shanks, MD 12/24/21 1154

## 2021-12-12 NOTE — Discharge Instructions (Signed)
Please take antibiotics as prescribed.  Also can give you some steroids for the next 3 days.  Like for you to contact ENT to schedule a follow-up appointment.  Please return to the emergency department if you experience any worsening symptoms including trouble breathing, trouble talking, trouble opening your mouth.

## 2021-12-12 NOTE — ED Notes (Signed)
Was able to obtain pt's vitals and EKG, blood work was collected. Advised pt that we would have him in the lobby until a room became available. Pt's mother became very irate at this time, she stated "you're going to put him in the lobby while he's in pain?" I advised his mom that he would have to wait until a room became available before he could see the dr. As I was wheeling the pt to the lobby his mom was on the phone with [facility]. Pt may leave prior to being seen. CN aware.

## 2021-12-12 NOTE — ED Triage Notes (Signed)
Pt states that he has had a sore throat since Monday, tonight began to have CP , nausea, SOB and neck pain

## 2023-11-21 ENCOUNTER — Emergency Department (HOSPITAL_BASED_OUTPATIENT_CLINIC_OR_DEPARTMENT_OTHER)
Admission: EM | Admit: 2023-11-21 | Discharge: 2023-11-21 | Disposition: A | Discharge status: Home / Self Care | Payer: Self-pay | Attending: Emergency Medicine | Admitting: Emergency Medicine

## 2023-11-21 ENCOUNTER — Other Ambulatory Visit: Payer: Self-pay

## 2023-11-21 ENCOUNTER — Encounter (HOSPITAL_BASED_OUTPATIENT_CLINIC_OR_DEPARTMENT_OTHER): Payer: Self-pay

## 2023-11-21 DIAGNOSIS — S51811A Laceration without foreign body of right forearm, initial encounter: Secondary | ICD-10-CM | POA: Insufficient documentation

## 2023-11-21 DIAGNOSIS — S61411A Laceration without foreign body of right hand, initial encounter: Secondary | ICD-10-CM

## 2023-11-21 DIAGNOSIS — W25XXXA Contact with sharp glass, initial encounter: Secondary | ICD-10-CM | POA: Insufficient documentation

## 2023-11-21 MED ORDER — CEFADROXIL 500 MG PO CAPS: 500.0000 mg | ORAL_CAPSULE | Freq: Two times a day (BID) | ORAL | 0 refills | Status: AC

## 2023-11-21 MED ORDER — BACITRACIN ZINC 500 UNIT/GM EX OINT
TOPICAL_OINTMENT | Freq: Two times a day (BID) | CUTANEOUS | Status: DC
  Administered 2023-11-21: 31.5 via TOPICAL
  Filled 2023-11-21: qty 28.35

## 2023-11-21 MED ORDER — BACITRACIN ZINC 500 UNIT/GM EX OINT: 1.0000 | TOPICAL_OINTMENT | Freq: Two times a day (BID) | CUTANEOUS | 0 refills | Status: AC

## 2023-11-21 NOTE — ED Triage Notes (Signed)
 In for eval of lac to right wrist approx 1 cm, lac to right hand approx 2 cm and lac/avulsion approx 2 cm x 1 cm to right middle finger. Cut on glass 2 days ago. Washed the wounds, peroxide, alcohol, and antibiotic cream on wounds.

## 2023-11-21 NOTE — ED Notes (Signed)
 Pt wound cleaned; ointment & bandage applied. Pt tolerated well

## 2023-11-21 NOTE — Discharge Instructions (Signed)
 As discussed, continue washing his gently with warm soapy water.  Patient on antibiotics given concern for developing infection.  Will also continue to place antibiotic ointment over counts and cover.  Recommend changing bandages at least once daily.  He may take Tylenol , ibuprofen  for any pain.  Please do not hesitate to return if the worrisome signs and symptoms we discussed become apparent.

## 2023-11-21 NOTE — ED Provider Notes (Signed)
 [facility] AT [facility] Provider Note   CSN: [id number] Arrival date & time: 11/21/23  1043     History  Chief Complaint  Patient presents with   Laceration    Antonio Thornton is a 33 y.o. male.   Laceration   33 year old male presents emergency department with complaints of lacerations on his right arm.  Was replacing a window 2 days ago when the glass broke causing a cut on his right forearm, 1 on his right palm and one on his right middle finger.  Reports being up-to-date on his Tdap.  Has been performing local wound care at home.  Presents due to swelling as well as pain and concerned that he may need it repaired.  Denies any fevers, chills, night sweats or weakness or sensory deficits right arm.  No significant pertinent past medical history.  Home Medications Prior to Admission medications   Medication Sig Start Date End Date Taking? Authorizing Provider  bacitracin ointment Apply 1 Application topically 2 (two) times daily. 11/21/23  Yes Neil Balls A, PA  cefadroxil (DURICEF) 500 MG capsule Take 1 capsule (500 mg total) by mouth 2 (two) times daily. 11/21/23  Yes Neil Balls A, PA  acetaminophen  (TYLENOL ) 500 MG tablet Take 1,000 mg by mouth every 6 (six) hours as needed for mild pain.    [provider]  ibuprofen  (IBU) 800 MG tablet Take 1 tablet (800 mg total) by mouth every 8 (eight) hours as needed for moderate pain. 06/22/21   Horton, Kristie M, DO  phenol (CHLORASEPTIC GARGLE) 1.4 % LIQD Use as directed 1 spray in the mouth or throat as needed for throat irritation / pain. 06/22/21   Horton, Kristie M, DO  predniSONE  (DELTASONE ) 20 MG tablet Take 2 tablets (40 mg total) by mouth daily with breakfast. For the next four days 02/25/21   Dorenda Gandy, MD      Allergies    Patient has no known allergies.    Review of Systems   Review of Systems  All other systems reviewed and are negative.   Physical Exam Updated Vital  Signs BP 125/75 (BP Location: Left Arm)   Pulse 75   Temp 98.4 F (36.9 C)   Resp 16   SpO2 100%  Physical Exam Vitals and nursing note reviewed.  Constitutional:      General: He is not in acute distress.    Appearance: He is well-developed.  HENT:     Head: Normocephalic and atraumatic.  Eyes:     Conjunctiva/sclera: Conjunctivae normal.  Cardiovascular:     Rate and Rhythm: Normal rate and regular rhythm.     Heart sounds: No murmur heard. Pulmonary:     Effort: Pulmonary effort is normal. No respiratory distress.     Breath sounds: Normal breath sounds.  Abdominal:     Palpations: Abdomen is soft.     Tenderness: There is no abdominal tenderness.  Musculoskeletal:        General: No swelling.     Cervical back: Neck supple.     Comments: Very superficial laceration appreciated distal third anterior aspect of right forearm measuring 1.0 cm in length.  No obvious foreign body.  No obvious tendinous involvement.  No active bleeding.  Patient with again, superficial laceration mainly involving callus palmar aspect distal phalanx metacarpal.  Additional skin tear appreciated on the lower aspect third digit right hand left PIP.  Patient able to range digits of right hand as well as wrist  fully.  Cap refill less than 2 seconds.  Radial pulses 2+ bilaterally.  Skin:    General: Skin is warm and dry.     Capillary Refill: Capillary refill takes less than 2 seconds.  Neurological:     Mental Status: He is alert.  Psychiatric:        Mood and Affect: Mood normal.     ED Results / Procedures / Treatments   Labs (all labs ordered are listed, but only abnormal results are displayed) Labs Reviewed - No data to display  EKG None  Radiology No results found.  Procedures Procedures    Medications Ordered in ED Medications  bacitracin ointment (has no administration in time range)    ED Course/ Medical Decision Making/ A&P                                 Medical  Decision Making Risk OTC drugs. Prescription drug management.   This patient presents to the ED for concern of laceration, this involves an extensive number of treatment options, and is a complaint that carries with it a high risk of complications and morbidity.  The differential diagnosis includes foreign body retainment, ligamentous/tendinous injury, neurovasc compromise, fracture, dislocation, other   Co morbidities that complicate the patient evaluation  See HPI   Additional history obtained:  Additional history obtained from EMR External records from outside source obtained and reviewed including hospital records   Lab Tests:  N/a   Imaging Studies ordered:  Patient declined   Cardiac Monitoring: / EKG:  N/a   Consultations Obtained:  N/a   Problem List / ED Course / Critical interventions / Medication management  Right forearm laceration, skin tear right hand I ordered medication including bacitracin   Reevaluation of the patient after these medicines showed that the patient stayed the same I have reviewed the patients home medicines and have made adjustments as needed   Social Determinants of Health:  Chronic cigar use.  Denies illicit drug use.   Test / Admission - Considered:  Right forearm laceration, skin tear right hand Vitals signs within normal range and stable throughout visit. 33 year old male presents emergency department with complaints of lacerations on his right arm.  Was replacing a window 2 days ago when the glass broke causing a cut on his right forearm, 1 on his right palm and one on his right middle finger.  Reports being up-to-date on his Tdap.  Has been performing local wound care at home.  Presents due to swelling as well as pain and concerned that he may need it repaired.  Denies any fevers, chills, night sweats or weakness or sensory deficits right arm. On exam, very superficial laceration appreciated anterior aspect of right  forearm, right palm distal fifth metacarpal as well as skin tear lateral aspect right third digit left hand as above.  Given the patient's wounds been there for greater than 48 hours and appear to be very superficial in nature, wounds better to heal by secondary intention.  No evidence clinically ligament/tendinous involvement or significant neurovascular compromise.  No obvious bony tenderness on exam to be suspicious for fracture or dislocation.  Offered x-ray imaging to assess for possible foreign body glass fragments.  Patient states that the glass was mainly in large pieces is not concerned about smaller pieces being in the wound; declined x-ray imaging.  Will recommend local wound care at home.  Recommend close follow-up with  PCP in the outpatient setting for reassessment.  Treatment plan discussed with patient and he acknowledged understanding was agreeable to said plan.  Patient overall well-appearing, afebrile in no acute distress. Worrisome signs and symptoms were discussed with the patient, and the patient acknowledged understanding to return to the ED if noticed. Patient was stable upon discharge.          Final Clinical Impression(s) / ED Diagnoses Final diagnoses:  Laceration of right forearm, initial encounter  Skin tear of right hand without complication, initial encounter    Rx / DC Orders ED Discharge Orders          Ordered    cefadroxil (DURICEF) 500 MG capsule  2 times daily        11/21/23 1200    bacitracin ointment  2 times daily        11/21/23 1200              Nord Butter, [place] 11/21/23 1214    Rosealee Concha, MD 11/21/23 1253

## 2023-11-21 NOTE — ED Notes (Signed)
 Pt alert and oriented X 4 at the time of discharge. RR even and unlabored. No acute distress noted. Pt verbalized understanding of discharge instructions as discussed. Pt ambulatory to lobby at time of discharge.

## 2024-04-18 IMAGING — CT CT NECK W/ CM
4 of 5 series · 14 of 33 positions shown, 16 images · IV contrast (APPLIED)
Comparison: None Available.

CLINICAL DATA: 31-year-old male with possible tonsillar abscess.

EXAM:
CT NECK WITH CONTRAST
TECHNIQUE: Multidetector CT imaging of the neck was performed using the
standard protocol following the bolus administration of intravenous
contrast.

[Series 4: axial bone · axial · 0.43mm/px · z∈[-206,-82]mm · 3 of 126 slices shown]
[im 32/126  bone]
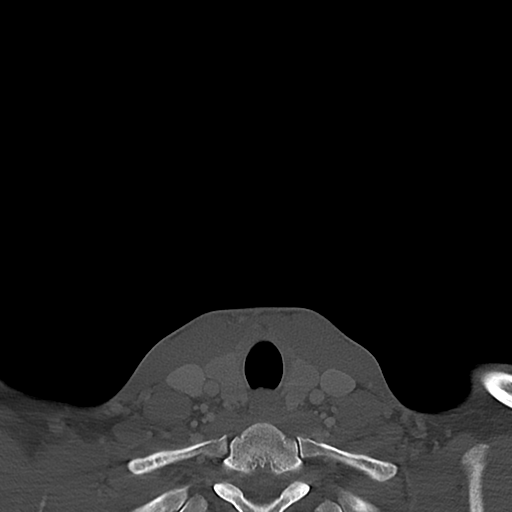
[im 63/126  bone]
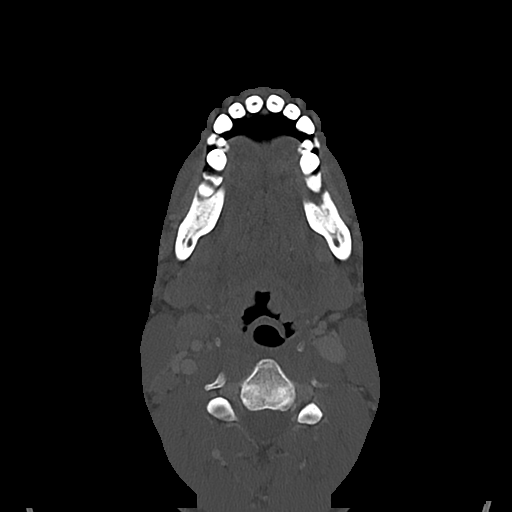
[im 94/126  bone]
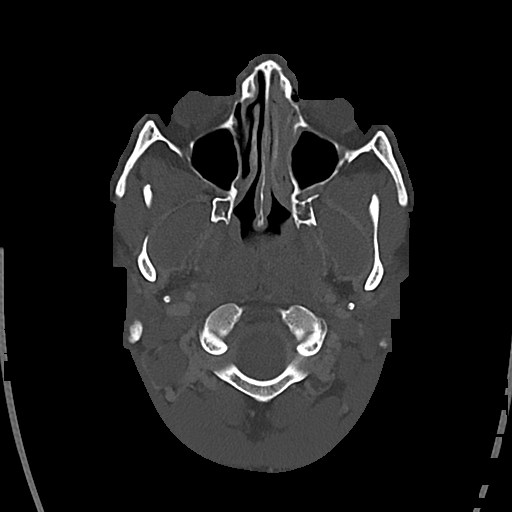

[Series 5: sag neck · sagittal · 0.47mm/px · 5 of 86 slices shown, 6 images]
[im 29/86  bone]
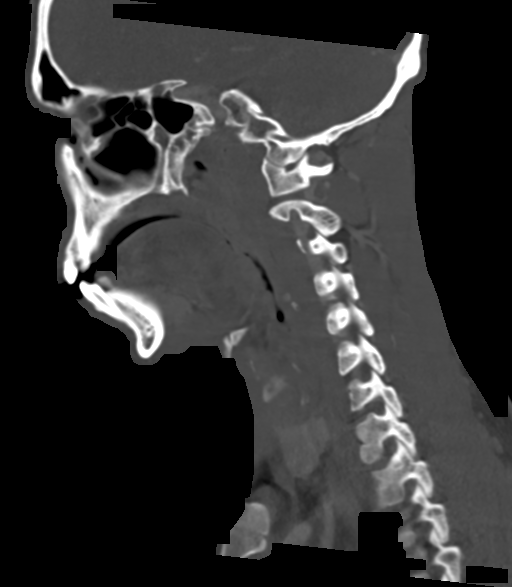
[im 36/86  bone]
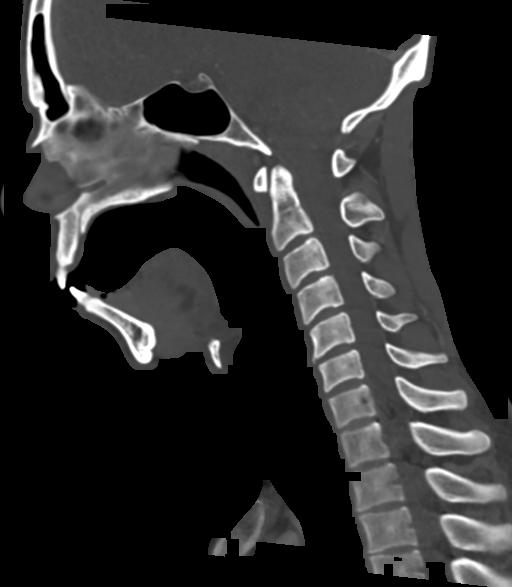
[im 43/86  soft-tissue]
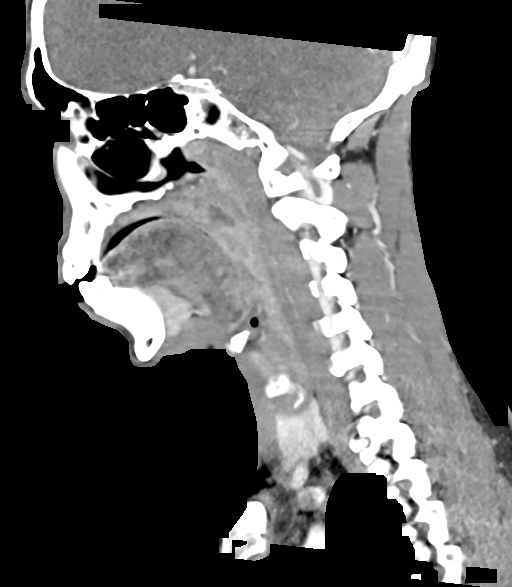
[im 43/86  bone]
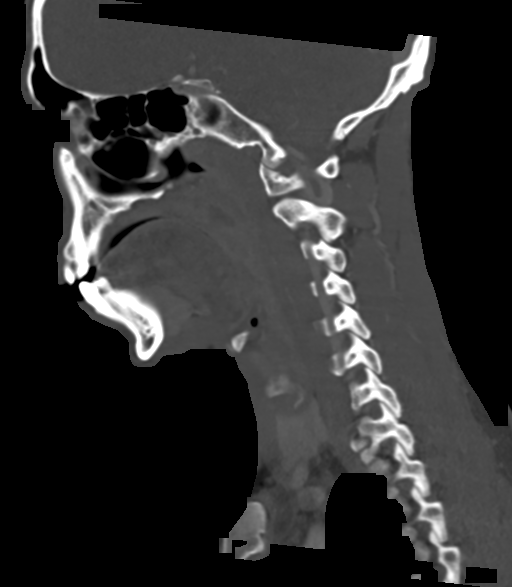
[im 50/86  bone]
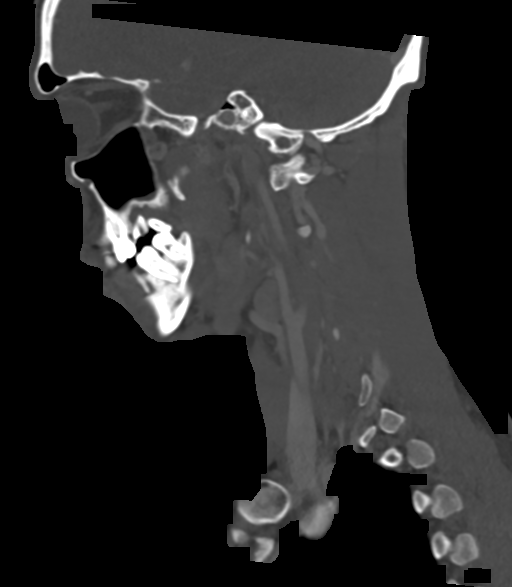
[im 57/86  bone]
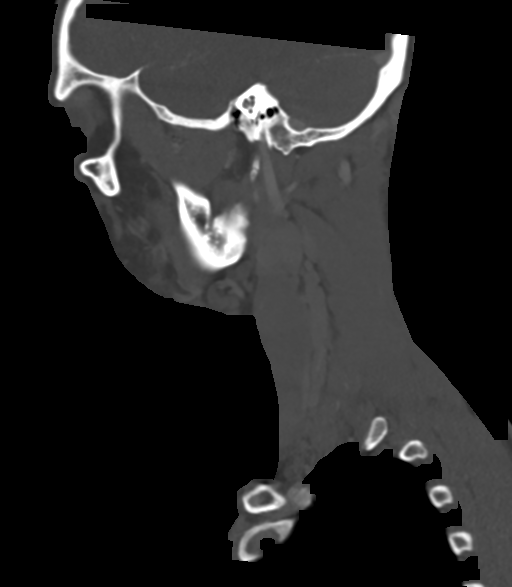

[Series 6: cor neck · coronal · 0.43mm/px · 3 of 122 slices shown]
[im 25/122  bone]
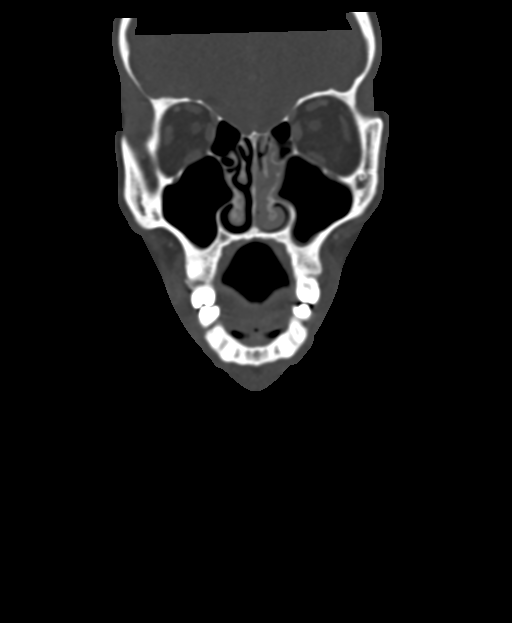
[im 49/122  bone]
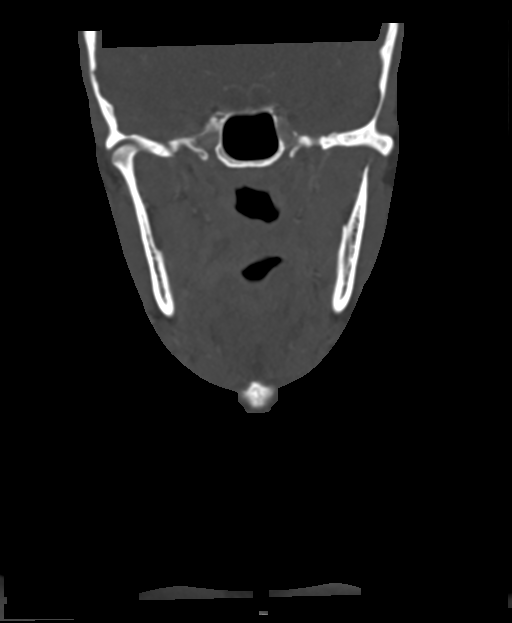
[im 73/122  bone]
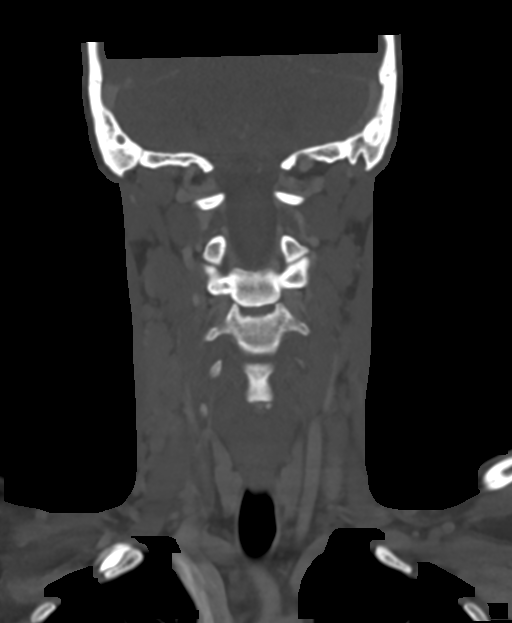

[Series 7: ax oropharynx · axial · 0.46mm/px · z∈[-219,-87]mm · 3 of 133 slices shown, 4 images]
[im 34/133  soft-tissue]
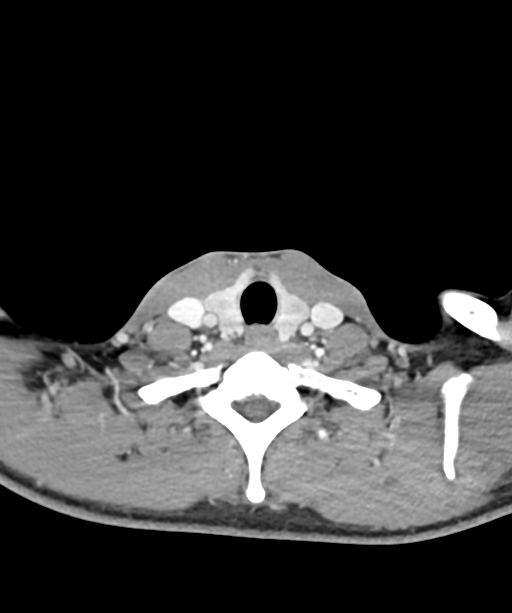
[im 34/133  bone]
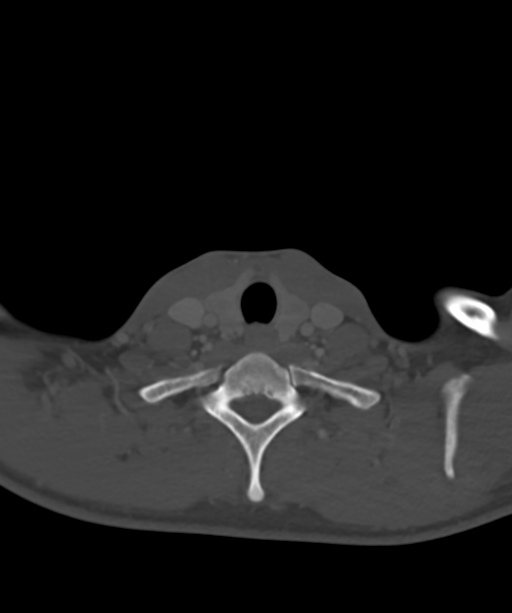
[im 67/133  bone]
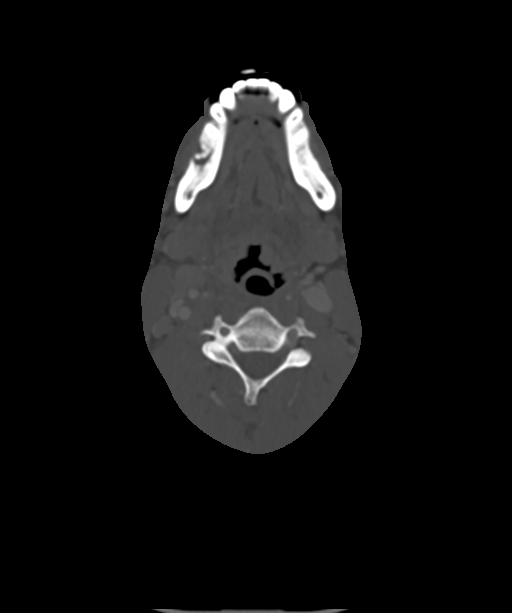
[im 100/133  bone]
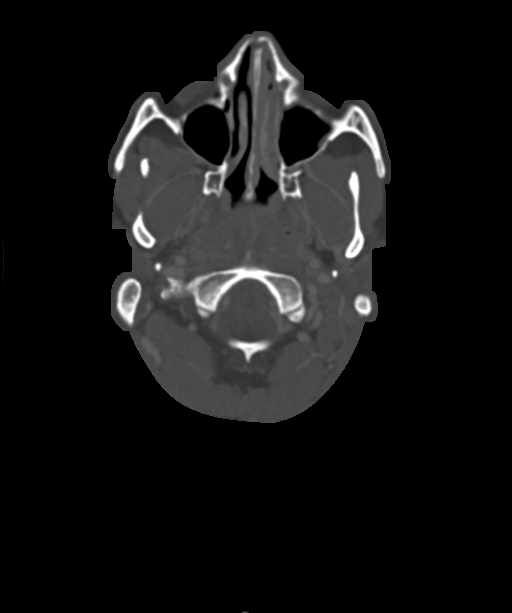

[14 of 33 positions shown; findings below may reference images not displayed]

RADIATION DOSE REDUCTION: This exam was performed according to the
departmental dose-optimization program which includes automated
exposure control, adjustment of the mA and/or kV according to
patient size and/or use of iterative reconstruction technique.

CONTRAST:  100mL OMNIPAQUE IOHEXOL 300 MG/ML  SOLN
FINDINGS: Pharynx and larynx: Larynx and epiglottis are within normal limits.

Mildly hypertrophied lingual tonsil, adenoids. Moderately
hypertrophied and asymmetric palatine tonsils, with both asymmetric
hyperenhancement of the right tonsil, and a stellate or crescent
shaped area of internal tonsillar hypodensity consistent with
abscess (series 3, image 48).

The abscess appears to track to the midline at the uvula. It is
dominant component is 9 x 21 x 13 mm (AP by transverse by CC) for an
estimated volume of about 2 mL.

Surprisingly little inflammation in the right parapharyngeal space.
Left parapharyngeal space remains normal. Minimal if any
retropharyngeal space edema.

Salivary glands: Sublingual space and bilateral salivary glands are
within normal limits.

Thyroid: Negative.

Lymph nodes: Reactive appearing bilateral level 2 lymph nodes are
larger on the right, up to 13 mm short axis on that side. Small
enhancing retropharyngeal lymph nodes appear reactive. No cystic or
necrotic nodes.

Vascular: Major vascular structures in the neck and at the skull
base appear patent and normal.

Limited intracranial: Negative.

Visualized orbits: Negative.

Mastoids and visualized paranasal sinuses: Only trace maxillary
sinus mucosal thickening. Tympanic cavities and mastoids are clear.
No sinus fluid levels.

Skeleton: Poor dentition. Carious posterior dentition with
multifocal bilateral periapical lucency. No other No acute osseous
abnormality identified.

Upper chest: Negative.
IMPRESSION: 1. Positive for Acute Tonsillitis with a Right Tonsillar Abscess
(crescent-shaped, up to 2.1 cm), tracking toward the uvula.
2. Reactive retropharyngeal and right greater than left level 2
lymph nodes.
3. Poor dentition.

## 2024-04-18 IMAGING — CR DG CHEST 2V
2 series · 2 of 2 positions shown · non-contrast
Comparison: PA and lateral 05/02/2018

CLINICAL DATA: Chest pain, nausea, and shortness of breath.

EXAM:
CHEST - 2 VIEW

[chest lat]
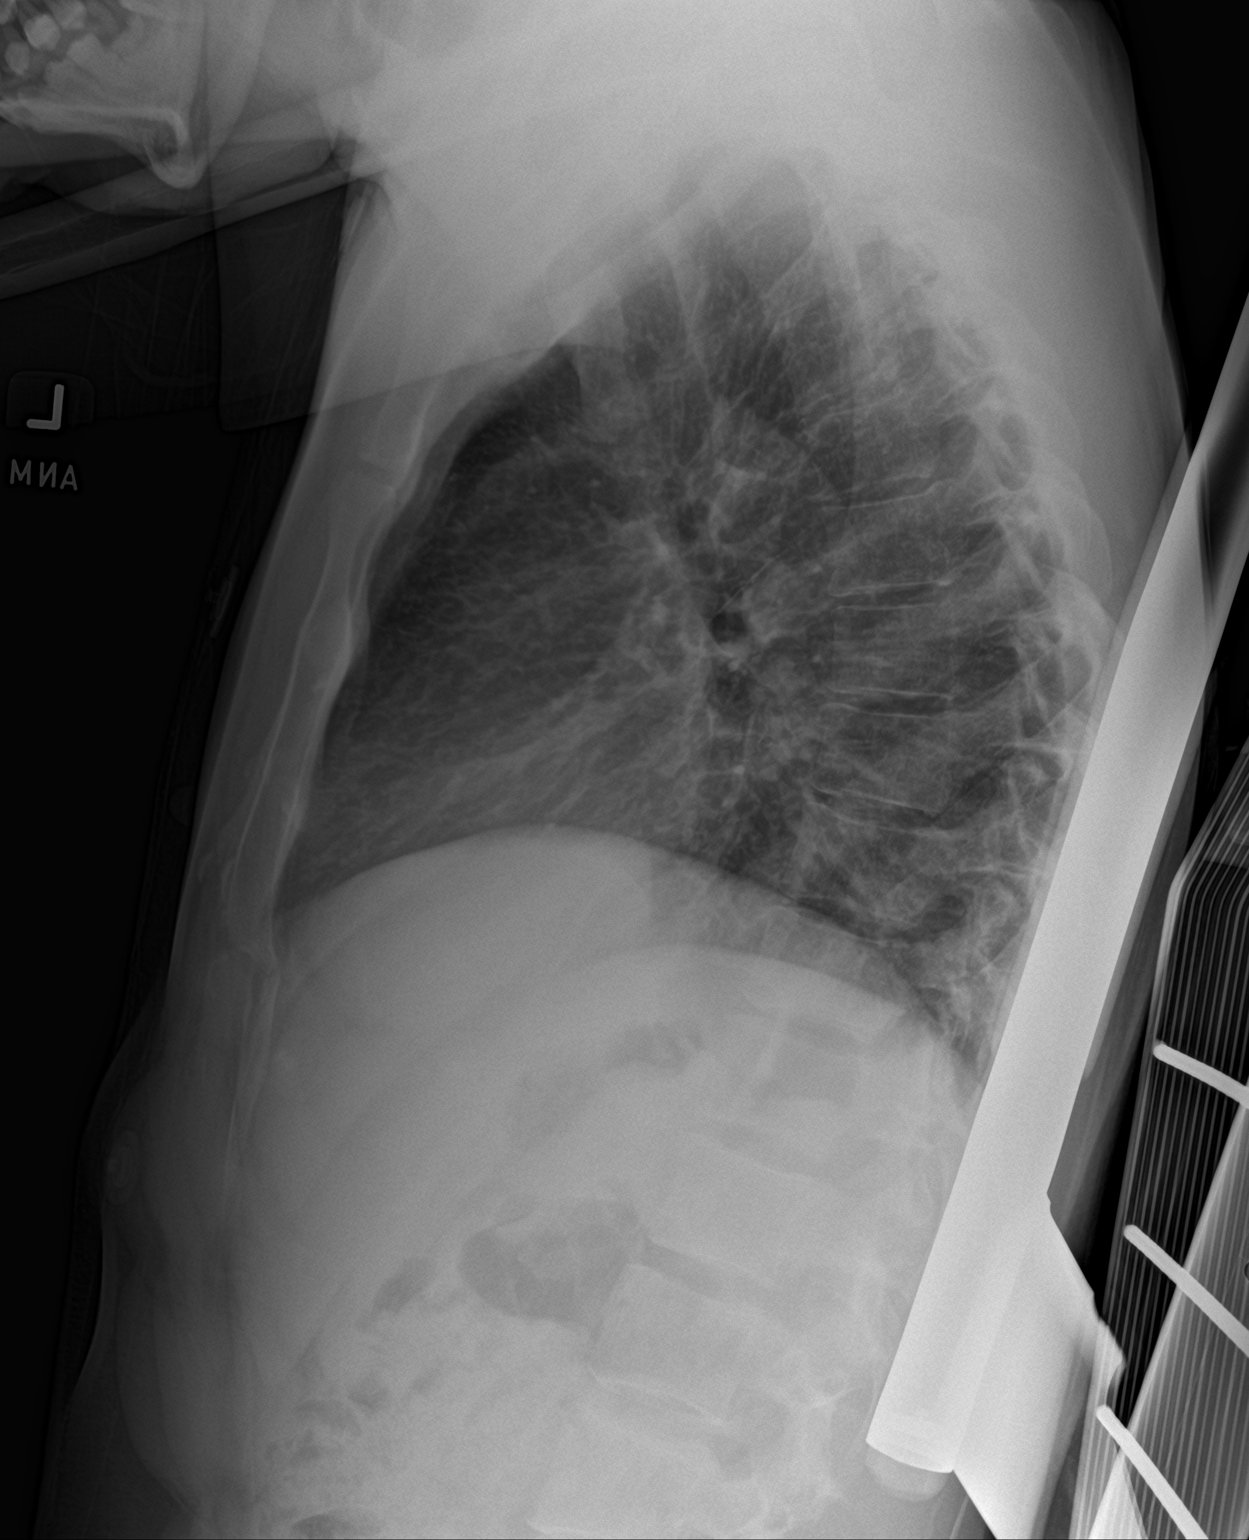

[chest ap]
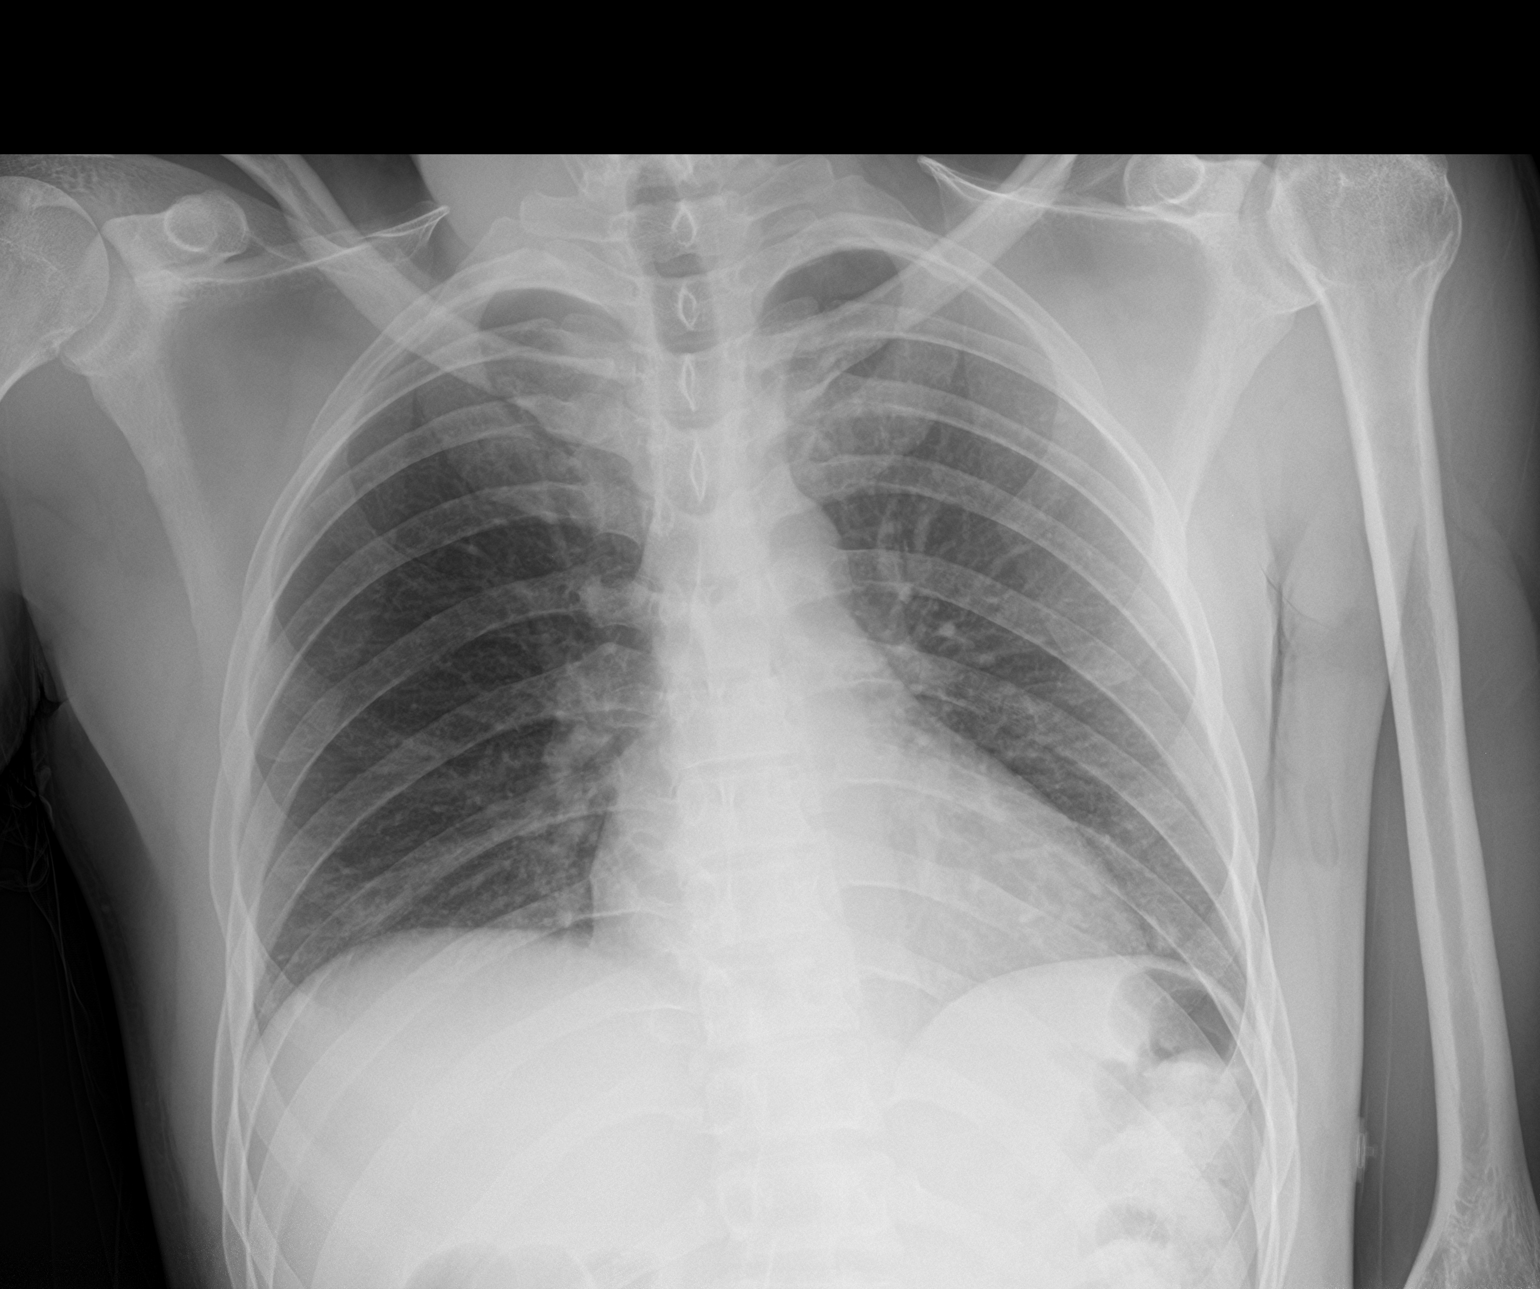

[2 of 2 positions shown; findings below may reference images not displayed]

FINDINGS: The cardiac size is normal. The lungs are expiratory with limited
view of the lower zones. The visualized lungs clear and no pleural
effusion is seen. There is a normal mediastinal configuration with
intact thoracic cage.
IMPRESSION: Negative expiratory study.  Limited view of the lower zones.
